# Patient Record
Sex: Male | Born: 1945 | Race: Black or African American | Hispanic: No | Marital: Single | Smoking: Current some day smoker
Health system: Southern US, Community
[De-identification: ages and names within clinical notes are randomized; demographics above are authoritative.]

## PROBLEM LIST (undated history)

## (undated) DIAGNOSIS — I10 Essential (primary) hypertension: Secondary | ICD-10-CM

## (undated) DIAGNOSIS — B009 Herpesviral infection, unspecified: Secondary | ICD-10-CM

## (undated) DIAGNOSIS — M5430 Sciatica, unspecified side: Secondary | ICD-10-CM

## (undated) DIAGNOSIS — N200 Calculus of kidney: Secondary | ICD-10-CM

## (undated) DIAGNOSIS — F1010 Alcohol abuse, uncomplicated: Secondary | ICD-10-CM

## (undated) HISTORY — PX: TONSILLECTOMY: SUR1361

## (undated) HISTORY — PX: ABDOMINAL SURGERY: SHX537

## (undated) HISTORY — PX: PROSTATE SURGERY: SHX751

---

## 2009-01-01 ENCOUNTER — Emergency Department (HOSPITAL_COMMUNITY): Admission: EM | Admit: 2009-01-01 | Discharge: 2009-01-01 | Payer: Self-pay | Admitting: Emergency Medicine

## 2011-10-28 ENCOUNTER — Emergency Department (HOSPITAL_COMMUNITY)
Admission: EM | Admit: 2011-10-28 | Discharge: 2011-10-28 | Disposition: A | Discharge status: Home / Self Care | Payer: Medicare Other | Attending: Emergency Medicine | Admitting: Emergency Medicine

## 2011-10-28 ENCOUNTER — Encounter (HOSPITAL_COMMUNITY): Payer: Self-pay | Admitting: *Deleted

## 2011-10-28 ENCOUNTER — Emergency Department (HOSPITAL_COMMUNITY): Payer: Medicare Other

## 2011-10-28 DIAGNOSIS — E876 Hypokalemia: Secondary | ICD-10-CM | POA: Insufficient documentation

## 2011-10-28 DIAGNOSIS — I10 Essential (primary) hypertension: Secondary | ICD-10-CM | POA: Insufficient documentation

## 2011-10-28 DIAGNOSIS — J45909 Unspecified asthma, uncomplicated: Secondary | ICD-10-CM | POA: Insufficient documentation

## 2011-10-28 DIAGNOSIS — R531 Weakness: Secondary | ICD-10-CM

## 2011-10-28 DIAGNOSIS — R0602 Shortness of breath: Secondary | ICD-10-CM | POA: Insufficient documentation

## 2011-10-28 DIAGNOSIS — R5381 Other malaise: Secondary | ICD-10-CM | POA: Insufficient documentation

## 2011-10-28 DIAGNOSIS — E871 Hypo-osmolality and hyponatremia: Secondary | ICD-10-CM | POA: Insufficient documentation

## 2011-10-28 DIAGNOSIS — Z79899 Other long term (current) drug therapy: Secondary | ICD-10-CM | POA: Insufficient documentation

## 2011-10-28 HISTORY — DX: Calculus of kidney: N20.0

## 2011-10-28 HISTORY — DX: Essential (primary) hypertension: I10

## 2011-10-28 LAB — DIFFERENTIAL
Lymphocytes Relative: 21 % (ref 12–46)
Lymphs Abs: 1.8 10*3/uL (ref 0.7–4.0)
Monocytes Relative: 11 % (ref 3–12)
Neutro Abs: 5.7 10*3/uL (ref 1.7–7.7)
Neutrophils Relative %: 68 % (ref 43–77)

## 2011-10-28 LAB — COMPREHENSIVE METABOLIC PANEL
ALT: 21 U/L (ref 0–53)
Alkaline Phosphatase: 72 U/L (ref 39–117)
BUN: 8 mg/dL (ref 6–23)
Chloride: 95 mEq/L — ABNORMAL LOW (ref 96–112)
GFR calc Af Amer: 65 mL/min — ABNORMAL LOW (ref >90)
Glucose, Bld: 121 mg/dL — ABNORMAL HIGH (ref 70–99)
Potassium: 3.4 mEq/L — ABNORMAL LOW (ref 3.5–5.1)
Total Bilirubin: 0.1 mg/dL — ABNORMAL LOW (ref 0.3–1.2)

## 2011-10-28 LAB — URINALYSIS, ROUTINE W REFLEX MICROSCOPIC
Glucose, UA: NEGATIVE mg/dL
Ketones, ur: 15 mg/dL — AB
Nitrite: NEGATIVE
Protein, ur: 30 mg/dL — AB

## 2011-10-28 LAB — URINE MICROSCOPIC-ADD ON

## 2011-10-28 LAB — CBC
Hemoglobin: 12.1 g/dL — ABNORMAL LOW (ref 13.0–17.0)
RBC: 3.68 MIL/uL — ABNORMAL LOW (ref 4.22–5.81)
WBC: 8.5 10*3/uL (ref 4.0–10.5)

## 2011-10-28 MED ORDER — SODIUM CHLORIDE 0.9 % IV BOLUS (SEPSIS)
500.0000 mL | Freq: Once | INTRAVENOUS | Status: AC
  Administered 2011-10-28: 500 mL via INTRAVENOUS

## 2011-10-28 NOTE — ED Provider Notes (Deleted)
  Physical Exam  BP 121/71  Pulse 76  Temp(Src) 98.4 F (36.9 C) (Oral)  Resp 18  SpO2 98%  Physical Exam  ED Course  Procedures  MDM Patient was short of breath and weakness last couple weeks. States he had to wait to stand on church today. He states his elbow or trouble breathing. He states it has gotten worse since he started on a green pill for his anxiety. No weight loss. No chest pain. No headaches. He has had a decreased appetite overall. He'll be moved to an exam room for further evaluation      Harrold Donath R. Rubin Payor, MD 10/28/11 [phone]

## 2011-10-28 NOTE — Discharge Instructions (Signed)
Foods Rich in Potassium Food / Potassium (mg)  Apricots, dried,  cup / 378 mg   Apricots, raw, 1 cup halves / 401 mg   Avocado,  / 487 mg   Banana, 1 large / 487 mg   Beef, lean, round, 3 oz / 202 mg   Cantaloupe, 1 cup cubes / 427 mg   Dates, medjool, 5 whole / 835 mg   Ham, cured, 3 oz / 212 mg   Lentils, dried,  cup / 458 mg   Lima beans, frozen,  cup / 258 mg   Orange, 1 large / 333 mg   Orange juice, 1 cup / 443 mg   Peaches, dried,  cup / 398 mg   Peas, split, cooked,  cup / 355 mg   Potato, boiled, 1 medium / 515 mg   Prunes, dried, uncooked,  cup / 318 mg   Raisins,  cup / 309 mg   Salmon, pink, raw, 3 oz / 275 mg   Sardines, canned , 3 oz / 338 mg   Tomato, raw, 1 medium / 292 mg   Tomato juice, 6 oz / 417 mg   [place], 3 oz / 349 mg  Document Released: 07/02/2005 Document Revised: 03/14/2011 Document Reviewed: 11/15/2008 [facility] Patient Information 2012 [place], [place].Hyponatremia  Hyponatremia is when the amount of salt (sodium) in your blood is too low. When sodium levels are low, your cells will absorb extra water and swell. The swelling happens throughout the body, but it mostly affects the brain. Severe brain swelling (cerebral edema), seizures, or coma can happen.  CAUSES   Heart, kidney, or liver problems.   Thyroid problems.   Adrenal gland problems.   Severe vomiting and diarrhea.   Certain medicines or illegal drugs.   Dehydration.   Drinking too much water.   Low-sodium diet.  SYMPTOMS   Nausea and vomiting.   Confusion.   Lethargy.   Agitation.   Headache.   Twitching or shaking (seizures).   Unconsciousness.   Appetite loss.   Muscle weakness and cramping.  DIAGNOSIS  Hyponatremia is identified by a simple blood test. Your caregiver will perform a history and physical exam to try to find the cause and type of hyponatremia. Other tests may be needed to measure the amount of sodium in your blood and  urine. TREATMENT  Treatment will depend on the cause.   Fluids may be given through the vein (IV).   Medicines may be used to correct the sodium imbalance. If medicines are causing the problem, they will need to be adjusted.   Water or fluid intake may be restricted to restore proper balance.  The speed of correcting the sodium problem is very important. If the problem is corrected too fast, nerve damage (sometimes unchangeable) can happen. HOME CARE INSTRUCTIONS   Only take medicines as directed by your caregiver. Many medicines can make hyponatremia worse. Discuss all your medicines with your caregiver.   Carefully follow any recommended diet, including any fluid restrictions.   You may be asked to repeat lab tests. Follow these directions.   Avoid alcohol and recreational drugs.  SEEK MEDICAL CARE IF:   You develop worsening nausea, fatigue, headache, confusion, or weakness.   Your original hyponatremia symptoms return.   You have problems following the recommended diet.  SEEK IMMEDIATE MEDICAL CARE IF:   You have a seizure.   You faint.   You have ongoing diarrhea or vomiting.  MAKE SURE YOU:   Understand these  instructions.   Will watch your condition.   Will get help right away if you are not doing well or get worse.  Document Released: 06/22/2002 Document Revised: 06/21/2011 Document Reviewed: 12/17/2010 [facility] Patient Information 2012 [place], [place].

## 2011-10-28 NOTE — ED Provider Notes (Signed)
History     CSN: [id number]  Arrival date & time 10/28/11  1248   First MD Initiated Contact with Patient 10/28/11 1325      Chief Complaint  Patient presents with  . Shortness of Breath  . Weakness    (Consider location/radiation/quality/duration/timing/severity/associated sxs/prior treatment) Patient is a 66 y.o. male presenting with shortness of breath and weakness. The history is provided by the patient.  Shortness of Breath  The current episode started more than 2 weeks ago. Associated symptoms include shortness of breath. Pertinent negatives include no chest pain.  Weakness Primary symptoms do not include headaches, nausea or vomiting.  Additional symptoms include weakness. Additional symptoms do not include neck stiffness.   patient says her shortness of breath and generalized weakness the last 2 weeks. While he was at church today he states he got too weak to stand. He states he feels as if his breathing a little more heavy. He states that it may be related to a new excitability was started on beta screen. No chest pain. No cough. He states he's had decreased appetite. No nausea vomiting or diarrhea  Past Medical History  Diagnosis Date  . Asthma     childhood   . Hypertension   . Kidney calculi     PT reports MD is watching it ,has not removed it    Past Surgical History  Procedure Date  . Tonsillectomy     No family history on file.  History  Substance Use Topics  . Smoking status: Current Some Day Smoker    Types: Cigars  . Smokeless tobacco: Not on file  . Alcohol Use: Yes     daily      Review of Systems  Constitutional: Negative for activity change and appetite change.  HENT: Negative for neck stiffness.   Eyes: Negative for pain.  Respiratory: Positive for shortness of breath. Negative for chest tightness.   Cardiovascular: Negative for chest pain and leg swelling.  Gastrointestinal: Negative for nausea, vomiting, abdominal pain and diarrhea.    Genitourinary: Negative for flank pain.  Musculoskeletal: Negative for back pain.  Skin: Negative for rash.  Neurological: Positive for weakness and light-headedness. Negative for numbness and headaches.  Psychiatric/Behavioral: Negative for behavioral problems.    Allergies  Penicillins  Home Medications   Current Outpatient Rx  Name Route Sig Dispense Refill  . ASPIRIN EC 81 MG PO TBEC Oral Take 162 mg by mouth every 6 (six) hours as needed. For pain    . TESSALON PO Oral Take 1 tablet by mouth daily.    Marland Kitchen VITAMIN D PO Oral Take 1 tablet by mouth daily.    Marland Kitchen FLUTICASONE FUROATE 27.5 MCG/SPRAY NA SUSP Nasal Place 2 sprays into the nose daily as needed. allergies    . NAPHAZOLINE HCL 0.012 % OP SOLN Both Eyes Place 1 drop into both eyes 4 (four) times daily as needed. For allergy eyes    . PRESCRIPTION MEDICATION Oral Take 1 tablet by mouth at bedtime. High Blood pressure med    . PRESCRIPTION MEDICATION Oral Take 0.5 tablets by mouth 2 (two) times daily. For heart rhythm and blood pressure    . PRESCRIPTION MEDICATION Topical Apply topically daily as needed. Herpes ointment    . PRESCRIPTION MEDICATION Oral Take 0.5 tablets by mouth daily. Anxiety medication    . TAMSULOSIN HCL 0.4 MG PO CAPS Oral Take 0.4 mg by mouth daily after supper.      BP 121/71  Pulse 76  Temp(Src) 98.4 F (36.9 C) (Oral)  Resp 18  SpO2 98%  Physical Exam  Nursing note and vitals reviewed. Constitutional: He is oriented to person, place, and time. He appears well-developed and well-nourished.  HENT:  Head: Normocephalic and atraumatic.  Eyes: EOM are normal. Pupils are equal, round, and reactive to light.  Neck: Normal range of motion. Neck supple.  Cardiovascular: Normal rate, regular rhythm and normal heart sounds.   No murmur heard. Pulmonary/Chest: Effort normal and breath sounds normal.  Abdominal: Soft. Bowel sounds are normal. He exhibits no distension and no mass. There is no tenderness.  There is no rebound and no guarding.  Musculoskeletal: Normal range of motion. He exhibits no edema.  Neurological: He is alert and oriented to person, place, and time. No cranial nerve deficit.  Skin: Skin is warm and dry.  Psychiatric: He has a normal mood and affect.    ED Course  Procedures (including critical care time)  Labs Reviewed  CBC - Abnormal; Notable for the following:    RBC 3.68 (*)    Hemoglobin 12.1 (*)    HCT 34.2 (*)    All other components within normal limits  COMPREHENSIVE METABOLIC PANEL - Abnormal; Notable for the following:    Sodium 132 (*)    Potassium 3.4 (*)    Chloride 95 (*)    Glucose, Bld 121 (*)    Albumin 3.4 (*)    AST 40 (*)    Total Bilirubin 0.1 (*)    GFR calc non Af Amer 56 (*)    GFR calc Af Amer 65 (*)    All other components within normal limits  URINALYSIS, ROUTINE W REFLEX MICROSCOPIC - Abnormal; Notable for the following:    Color, Urine AMBER (*) BIOCHEMICALS MAY BE AFFECTED BY COLOR   APPearance CLOUDY (*)    Hgb urine dipstick MODERATE (*)    Bilirubin Urine SMALL (*)    Ketones, ur 15 (*)    Protein, ur 30 (*)    Leukocytes, UA TRACE (*)    All other components within normal limits  URINE MICROSCOPIC-ADD ON - Abnormal; Notable for the following:    Bacteria, UA FEW (*)    Casts HYALINE CASTS (*)    All other components within normal limits  DIFFERENTIAL   Dg Chest 2 View  10/28/2011  *RADIOLOGY REPORT*  Clinical Data: History of asthma, hypertension and smoking.  CHEST - 2 VIEW  Comparison: None.  Findings: There is mild elevation of the left hemidiaphragm with adjacent left basilar linear atelectasis or scarring.  The heart size is normal.  The lungs are otherwise clear.  There is no pleural effusion or pneumothorax.  No acute osseous findings are seen.  IMPRESSION: Mild left basilar scarring or atelectasis.  No acute cardiopulmonary process.  Original Report Authenticated By: Gerrianne Scale, M.D.     1. Weakness     2. Hypokalemia   3. Hyponatremia      Date: 10/28/2011  Rate: 66  Rhythm: normal sinus rhythm  QRS Axis: normal  Intervals: normal  ST/T Wave abnormalities: normal  Conduction Disutrbances:none  Narrative Interpretation:   Old EKG Reviewed: none available    MDM  Patient with shortness of breath and weakness. Lab work is reassuring, mild hyponatremia mild hypokalemia. Patient feels better after some fluids. Has tolerated orals. He'll be discharged home and followup his primary care Dr.        Juliet Rude. Rubin Payor, MD 10/28/11 [phone]

## 2011-10-28 NOTE — ED Notes (Signed)
Patient transported to X-ray 

## 2011-10-28 NOTE — ED Notes (Signed)
PT reports SHOB ,weakness for 2 weeks. This morning while at church Pt reports he was to weak to stand. Pt states he feels like he is breathing heavy.PT reports a HX of palpatations

## 2011-12-04 ENCOUNTER — Emergency Department (HOSPITAL_COMMUNITY)
Admission: EM | Admit: 2011-12-04 | Discharge: 2011-12-04 | Disposition: A | Discharge status: Home / Self Care | Payer: Medicare Other | Attending: Emergency Medicine | Admitting: Emergency Medicine

## 2011-12-04 ENCOUNTER — Emergency Department (HOSPITAL_COMMUNITY): Payer: Medicare Other

## 2011-12-04 ENCOUNTER — Encounter (HOSPITAL_COMMUNITY): Payer: Self-pay | Admitting: Emergency Medicine

## 2011-12-04 DIAGNOSIS — I10 Essential (primary) hypertension: Secondary | ICD-10-CM | POA: Insufficient documentation

## 2011-12-04 DIAGNOSIS — R5383 Other fatigue: Secondary | ICD-10-CM

## 2011-12-04 DIAGNOSIS — Z79899 Other long term (current) drug therapy: Secondary | ICD-10-CM | POA: Insufficient documentation

## 2011-12-04 DIAGNOSIS — F1010 Alcohol abuse, uncomplicated: Secondary | ICD-10-CM | POA: Insufficient documentation

## 2011-12-04 DIAGNOSIS — I7090 Unspecified atherosclerosis: Secondary | ICD-10-CM | POA: Insufficient documentation

## 2011-12-04 DIAGNOSIS — J45909 Unspecified asthma, uncomplicated: Secondary | ICD-10-CM | POA: Insufficient documentation

## 2011-12-04 DIAGNOSIS — R5381 Other malaise: Secondary | ICD-10-CM | POA: Insufficient documentation

## 2011-12-04 HISTORY — DX: Sciatica, unspecified side: M54.30

## 2011-12-04 HISTORY — DX: Herpesviral infection, unspecified: B00.9

## 2011-12-04 HISTORY — DX: Alcohol abuse, uncomplicated: F10.10

## 2011-12-04 LAB — POCT I-STAT TROPONIN I: Troponin i, poc: 0 ng/mL (ref 0.00–0.08)

## 2011-12-04 LAB — CBC
MCV: 96.9 fL (ref 78.0–100.0)
Platelets: 125 10*3/uL — ABNORMAL LOW (ref 150–400)
RBC: 3.55 MIL/uL — ABNORMAL LOW (ref 4.22–5.81)
RDW: 15.3 % (ref 11.5–15.5)
WBC: 4.5 10*3/uL (ref 4.0–10.5)

## 2011-12-04 LAB — COMPREHENSIVE METABOLIC PANEL
ALT: 28 U/L (ref 0–53)
AST: 69 U/L — ABNORMAL HIGH (ref 0–37)
Albumin: 3.5 g/dL (ref 3.5–5.2)
Alkaline Phosphatase: 63 U/L (ref 39–117)
CO2: 20 mEq/L (ref 19–32)
Chloride: 101 mEq/L (ref 96–112)
Creatinine, Ser: 0.69 mg/dL (ref 0.50–1.35)
GFR calc non Af Amer: >90 mL/min (ref >90)
Potassium: 3.5 mEq/L (ref 3.5–5.1)
Sodium: 140 mEq/L (ref 135–145)
Total Bilirubin: 0.4 mg/dL (ref 0.3–1.2)

## 2011-12-04 LAB — URINE MICROSCOPIC-ADD ON

## 2011-12-04 LAB — URINALYSIS, ROUTINE W REFLEX MICROSCOPIC
Bilirubin Urine: NEGATIVE
Glucose, UA: NEGATIVE mg/dL
Ketones, ur: NEGATIVE mg/dL
Leukocytes, UA: NEGATIVE
Protein, ur: 30 mg/dL — AB
pH: 5.5 (ref 5.0–8.0)

## 2011-12-04 MED ORDER — SODIUM CHLORIDE 0.9 % IV BOLUS (SEPSIS)
1000.0000 mL | Freq: Once | INTRAVENOUS | Status: AC
  Administered 2011-12-04: 1000 mL via INTRAVENOUS

## 2011-12-04 MED ORDER — ONDANSETRON HCL 8 MG PO TABS
8.0000 mg | ORAL_TABLET | Freq: Once | ORAL | Status: AC
  Administered 2011-12-04: 8 mg via ORAL
  Filled 2011-12-04: qty 1

## 2011-12-04 NOTE — ED Notes (Signed)
Pt smells of ETOH despite denying having any ETOH today.

## 2011-12-04 NOTE — ED Notes (Signed)
Pt returned from bathroom; pt states he wants to sit in chair.  Told pt that he could sit there for a little while; that nursing would get vital signs shortly from patient.

## 2011-12-04 NOTE — ED Notes (Signed)
Pt states to this RN, "I think I may feel better already. Maybe it's just being around people and talking."

## 2011-12-04 NOTE — ED Notes (Signed)
Pt coughing and hacking. Pt states he feels like he is gagging.  Pt states he is unsure what from. Asked if he wanted anything; pt denies needing/wanting anything.

## 2011-12-04 NOTE — ED Provider Notes (Signed)
History     CSN: [id number]  Arrival date & time 12/04/11  1546   First MD Initiated Contact with Patient 12/04/11 1549      Chief Complaint  Patient presents with  . Fatigue    (Consider location/radiation/quality/duration/timing/severity/associated sxs/prior treatment) HPI  Patient presents with complaint of generalized fatigue and weakness. He states he's been feeling this way for over 2 months.  He also reports feeling somewhat short of breath has also been constant over the past 2 weeks. He denies any chest pain. He denies any fever vomiting or diarrhea.  He also reports that he is a heavy/daily drinker- average of a fifth of whiskey daily.  Last drink was yesterday and today he felt that he might pass out.  Has had decreased po intake.  Takes blood pressure medication, but has had no recent change in meds.  There are no other alleviating or modifying factors, there are no other associated systemic symptoms.    Past Medical History  Diagnosis Date  . Asthma     childhood   . Hypertension   . Kidney calculi     PT reports MD is watching it ,has not removed it  . Alcohol abuse   . Sciatica   . Herpes     facial/lip    Past Surgical History  Procedure Date  . Tonsillectomy   . Abdominal surgery     biopsy  . Prostate surgery     History reviewed. No pertinent family history.  History  Substance Use Topics  . Smoking status: Current Some Day Smoker    Types: Cigars  . Smokeless tobacco: Not on file  . Alcohol Use: Yes     1/5 whiskey on daily average      Review of Systems ROS reviewed and all otherwise negative except for mentioned in HPI  Allergies  Penicillins  Home Medications   Current Outpatient Rx  Name Route Sig Dispense Refill  . VITAMIN D 1000 UNITS PO TABS Oral Take 1,000 Units by mouth daily.    Marland Kitchen FLUTICASONE FUROATE 27.5 MCG/SPRAY NA SUSP Nasal Place 2 sprays into the nose daily as needed. allergies    . LISINOPRIL-HYDROCHLOROTHIAZIDE  20-25 MG PO TABS Oral Take 1 tablet by mouth daily.    Marland Kitchen LOPERAMIDE HCL 2 MG PO CAPS Oral Take 2 mg by mouth 4 (four) times daily as needed. For constipation.    Marland Kitchen LOPERAMIDE-SIMETHICONE 2-125 MG PO TABS Oral Take 1 capsule by mouth every 6 (six) hours as needed. For constipation.    Marland Kitchen METOPROLOL TARTRATE 25 MG PO TABS Oral Take 12.5 mg by mouth 2 (two) times daily.    Marland Kitchen NAPHAZOLINE HCL 0.012 % OP SOLN Both Eyes Place 1 drop into both eyes 4 (four) times daily as needed. For allergy eyes    . PHENAZOPYRIDINE HCL 100 MG PO TABS Oral Take 100 mg by mouth 3 (three) times daily as needed. For burning/pain.    Marland Kitchen SERTRALINE HCL 50 MG PO TABS Oral Take 50 mg by mouth daily.    Marland Kitchen SILDENAFIL CITRATE 100 MG PO TABS Oral Take 100 mg by mouth daily as needed. For E.D.    . TAMSULOSIN HCL 0.4 MG PO CAPS Oral Take 0.4 mg by mouth daily after supper.      BP 155/83  Pulse 75  Temp(Src) 98.6 F (37 C) (Oral)  Resp 16  SpO2 97% Vitals reviewed Physical Exam Physical Examination: General appearance - alert, well appearing, and in no  distress Mental status - alert, oriented to person, place, and time Eyes - pupils equal and reactive, extraocular eye movements intact Mouth - mucous membranes moist, pharynx normal without lesions Chest - clear to auscultation, no wheezes, rales or rhonchi, symmetric air entry Heart - normal rate, regular rhythm, normal S1, S2, no murmurs, rubs, clicks or gallops Abdomen - soft, nontender, nondistended, no masses or organomegaly, nabs Neurological - alert, oriented, normal speech, no focal findings, cranial nerves grossly intact, strength 5/5 in extremities x 4 Extremities - peripheral pulses normal, no pedal edema, no clubbing or cyanosis Skin - normal coloration and turgor, no rashes, brisk cap refill Psych- calm, cooperative ED Course  Procedures (including critical care time)  Date: 12/04/2011  Rate: 64  Rhythm: normal sinus rhythm  QRS Axis: normal  Intervals:  normal, borderline prolonged PR interval  ST/T Wave abnormalities: nonspecific T wave changes  Conduction Disutrbances:none  Narrative Interpretation:   Old EKG Reviewed: none available    Labs Reviewed  CBC - Abnormal; Notable for the following:    RBC 3.55 (*)    Hemoglobin 11.7 (*)    HCT 34.4 (*)    Platelets 125 (*)    All other components within normal limits  COMPREHENSIVE METABOLIC PANEL - Abnormal; Notable for the following:    AST 69 (*)    All other components within normal limits  ETHANOL - Abnormal; Notable for the following:    Alcohol, Ethyl (B) 158 (*)    All other components within normal limits  URINALYSIS, ROUTINE W REFLEX MICROSCOPIC - Abnormal; Notable for the following:    Hgb urine dipstick TRACE (*)    Protein, ur 30 (*)    All other components within normal limits  URINE MICROSCOPIC-ADD ON - Abnormal; Notable for the following:    Casts HYALINE CASTS (*)    All other components within normal limits  POCT I-STAT TROPONIN I  LAB REPORT - SCANNED   Dg Chest 2 View  12/04/2011  *RADIOLOGY REPORT*  Clinical Data: Fatigue and weakness today.  CHEST - 2 VIEW  Comparison: Chest x-ray 10/28/2011.  Findings: Lung volumes are normal.  There is a linear opacity projecting in the region of the lingula, similar to prior studies, likely represent an area of scarring.  No consolidative airspace disease.  No pleural effusions.  Pulmonary vasculature is normal. Heart size is within normal limits. The patient is rotated to the right on today's exam, resulting in distortion of the mediastinal contours and reduced diagnostic sensitivity and specificity for mediastinal pathology.  Atherosclerotic calcifications in the thoracic aorta.  IMPRESSION: 1.  No radiographic evidence of acute cardiopulmonary disease. 2.  Atherosclerosis.  Original Report Authenticated By: Florencia Reasons, M.D.     1. Fatigue   2. Alcohol abuse       MDM  Pt with fatigue, alcohol abuse- labs  reassuring, pt has had occasional gagging while in ED- no actual vomiting.  No signs of alcohol withdrawal, pt actually mildly intoxicated, but cooperative, ambulatory.  PT tolerated fluids and food in the ED after zofran.  Discharged with strict return precautions and he is agreeable with this plan.  States he has an appointment scheduled with his doctor at the [facility] in [place].   Ethelda Chick, MD 12/05/11 [phone]

## 2011-12-04 NOTE — ED Notes (Signed)
Pt in from home via [facility] for increased malaise, fatigue; pt states "i'm an alcoholic and i havent had anything to drink today."  Pt alert and oriented.  Pt states that he can hardly feed himself.

## 2011-12-04 NOTE — ED Notes (Signed)
Pt ambulated to bathroom with steady gait. 

## 2011-12-04 NOTE — ED Notes (Signed)
Pt ambulated to BR without difficulty

## 2011-12-04 NOTE — Discharge Instructions (Signed)
Return to the ED with any concerns including chest pain, difficulty breathing, vomiting and not able to keep down liquids, fainting, seizure, decreased level of alertness/lethargy, or any other alarming symptoms

## 2011-12-05 MED ORDER — IBUPROFEN 100 MG/5ML PO SUSP
ORAL | Status: AC
  Filled 2011-12-05: qty 20

## 2013-04-17 IMAGING — CR DG CHEST 2V
2 series · 2 of 2 positions shown · non-contrast
Comparison: None.

CLINICAL DATA: History of asthma, hypertension and smoking.

CHEST - 2 VIEW

[w chest pa]
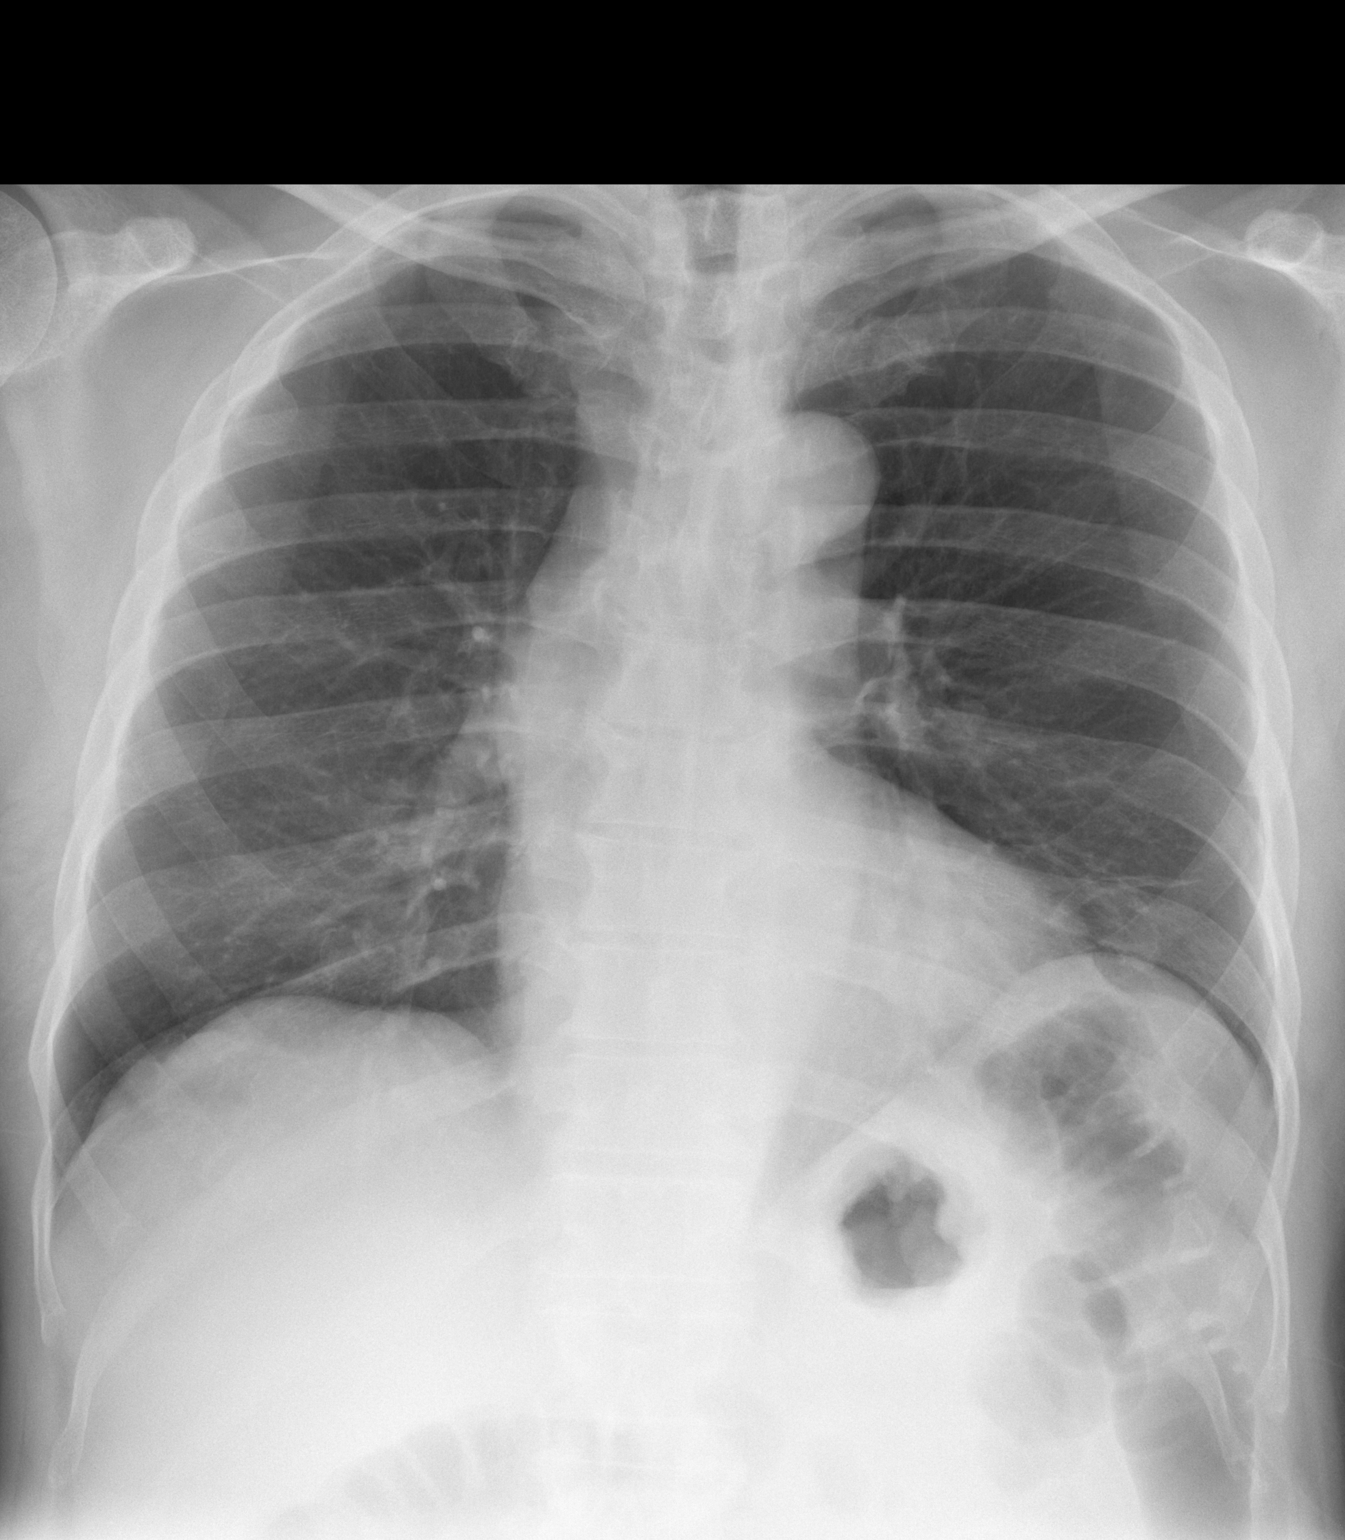

[w chest lat *]
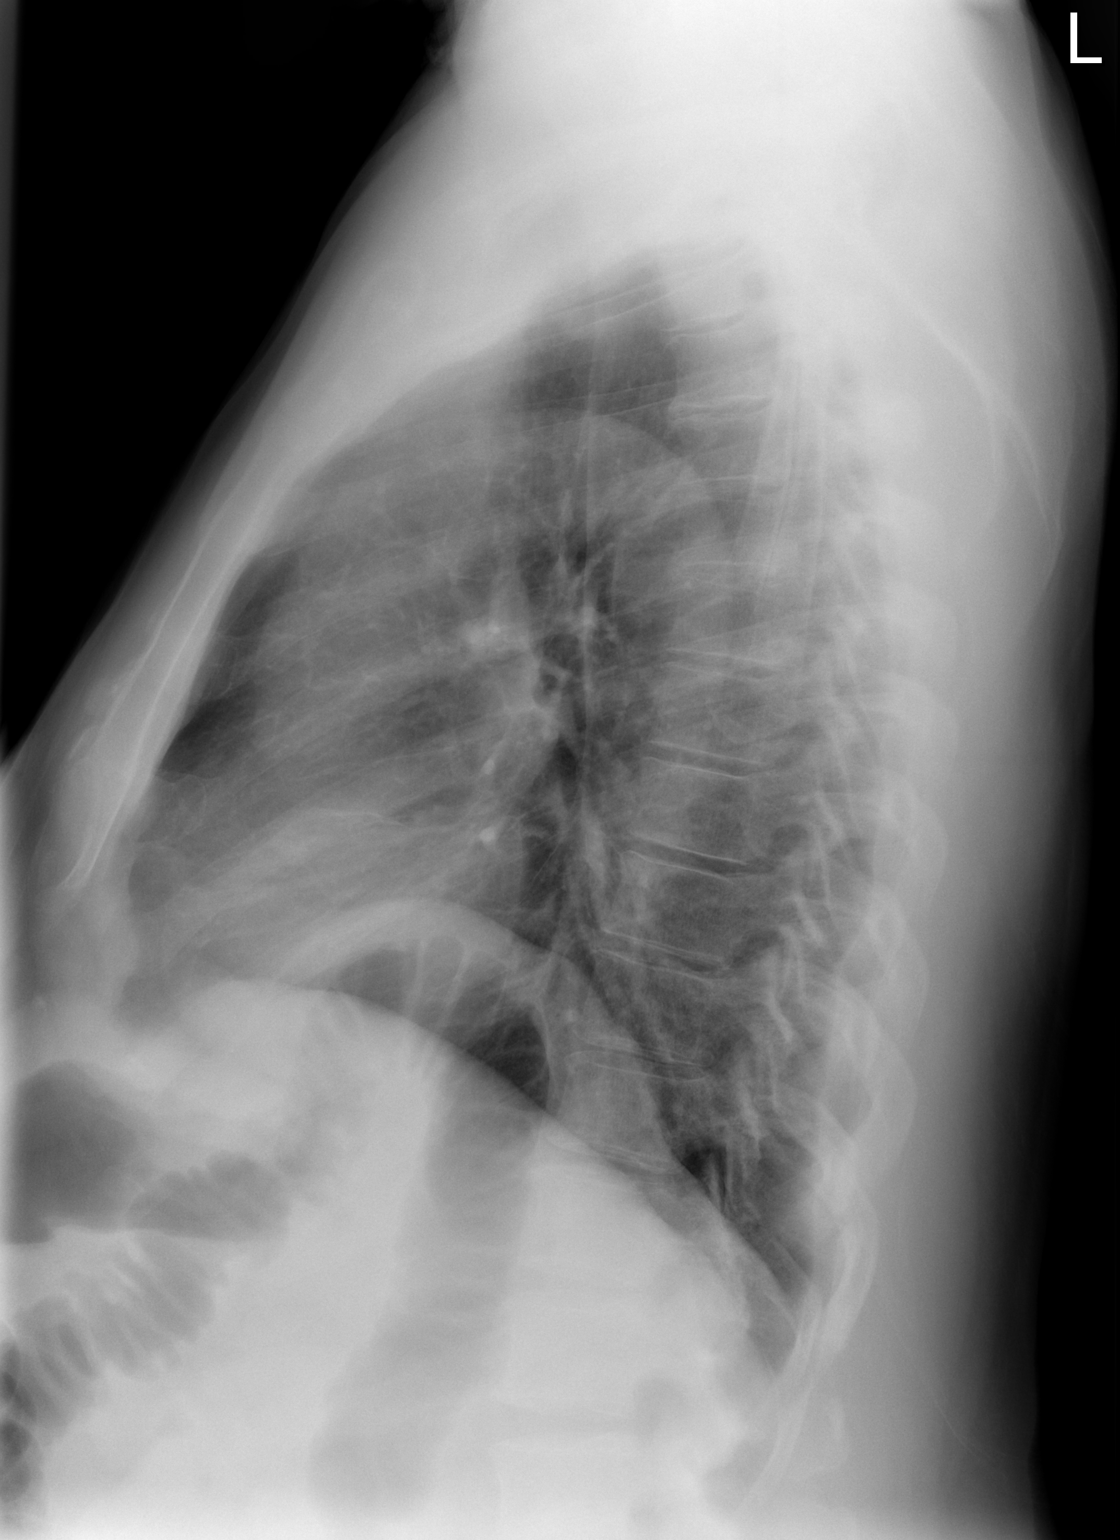

[2 of 2 positions shown; findings below may reference images not displayed]

FINDINGS: There is mild elevation of the left hemidiaphragm with
adjacent left basilar linear atelectasis or scarring.  The heart
size is normal.  The lungs are otherwise clear.  There is no
pleural effusion or pneumothorax.  No acute osseous findings are
seen.
IMPRESSION: Mild left basilar scarring or atelectasis.  No acute
cardiopulmonary process.

## 2013-05-24 IMAGING — CR DG CHEST 2V
2 series · 2 of 2 positions shown · non-contrast
Comparison: Chest x-ray 10/28/2011.

CLINICAL DATA: Fatigue and weakness today.

CHEST - 2 VIEW

[w chest pa]
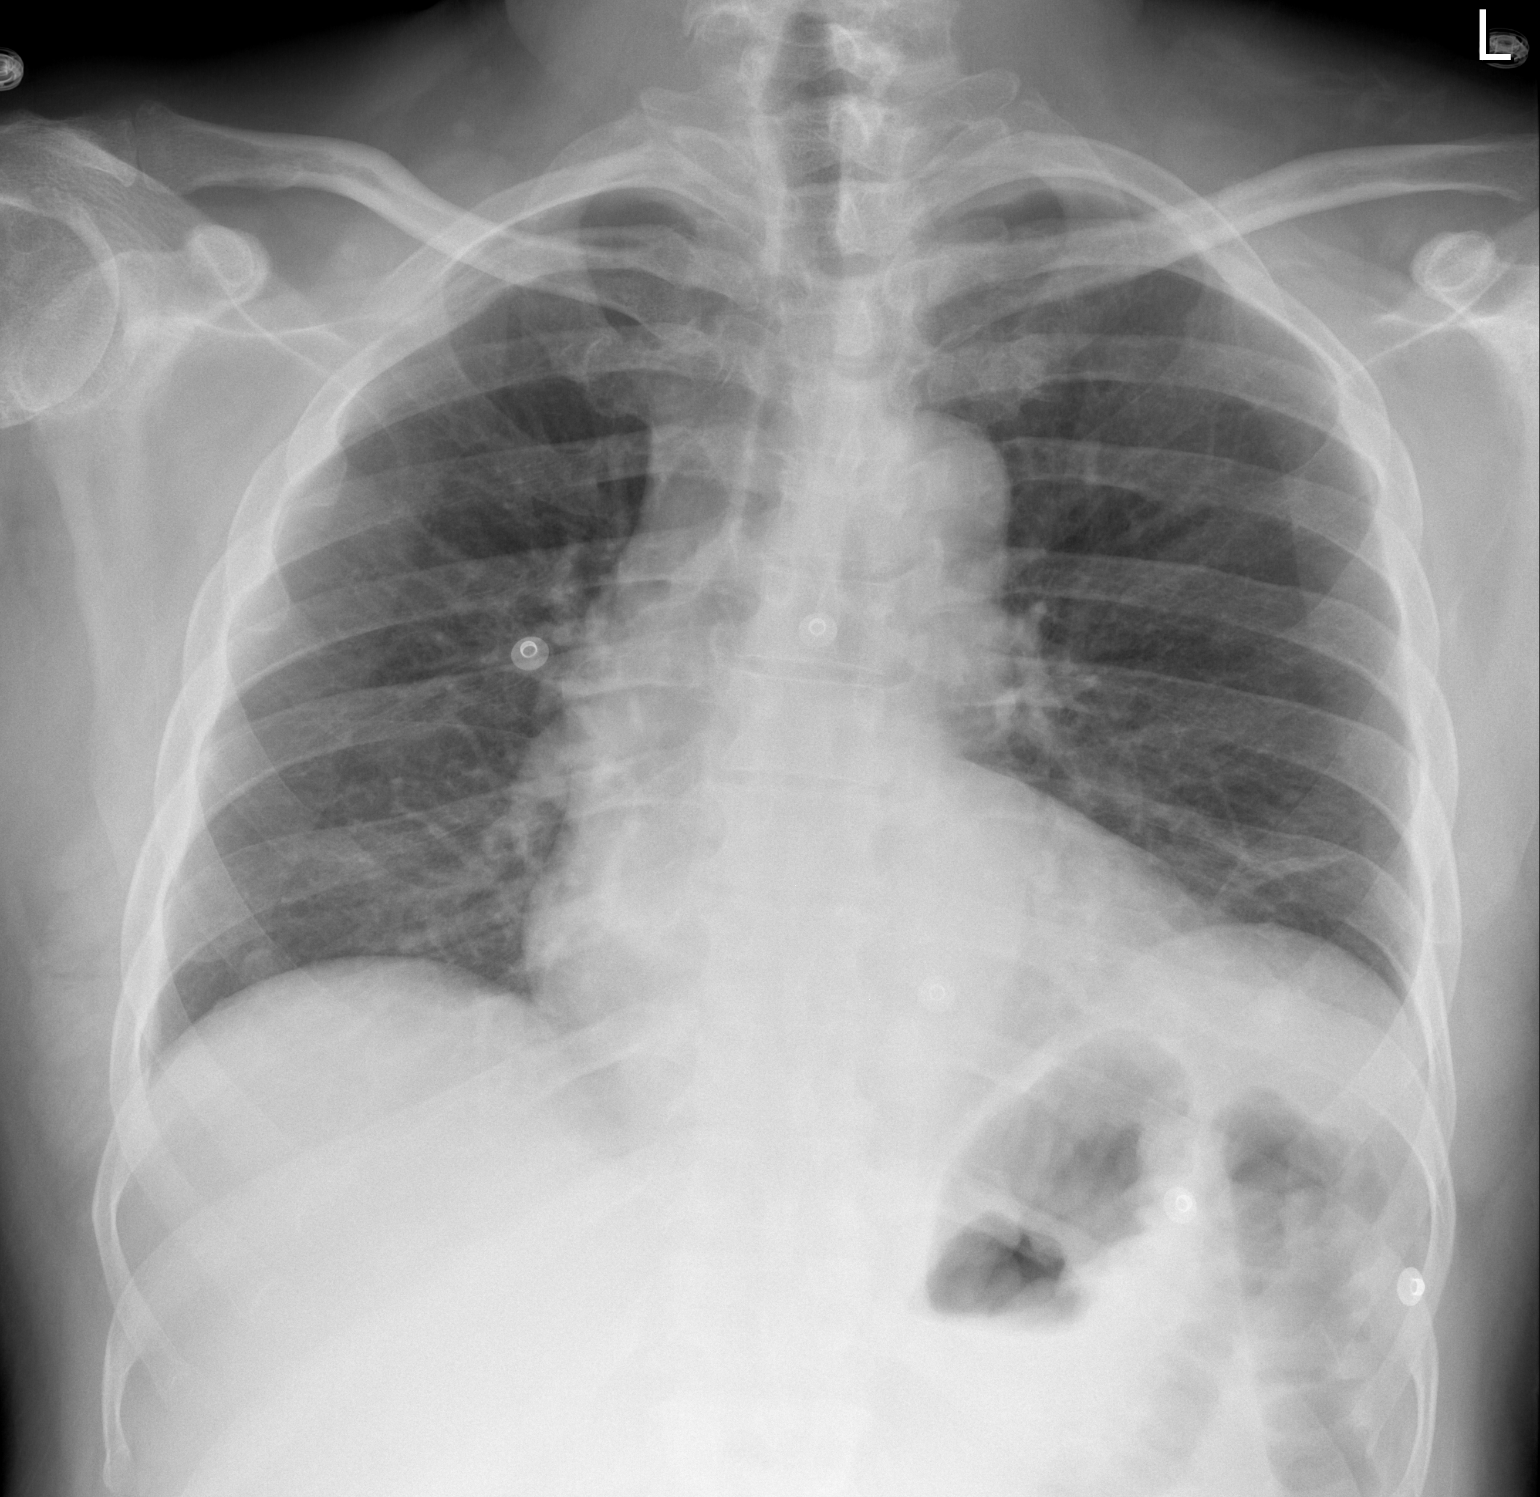

[w chest lat]
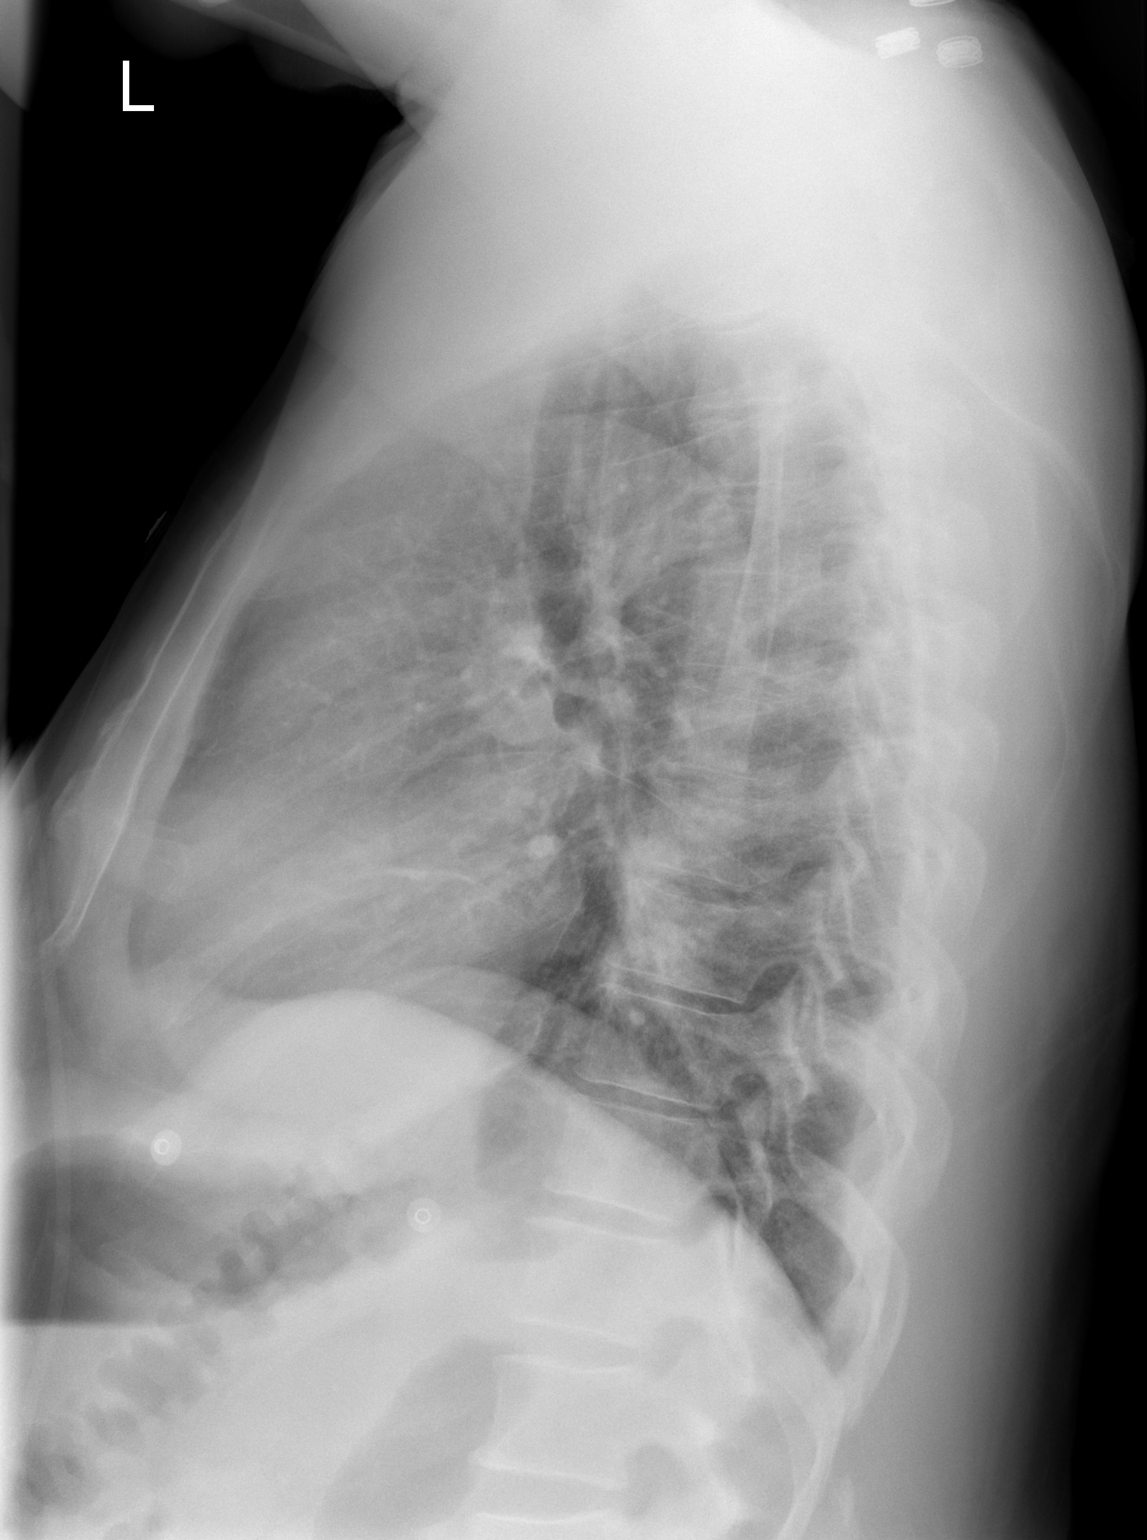

[2 of 2 positions shown; findings below may reference images not displayed]

FINDINGS: Lung volumes are normal.  There is a linear opacity
projecting in the region of the lingula, similar to prior studies,
likely represent an area of scarring.  No consolidative airspace
disease.  No pleural effusions.  Pulmonary vasculature is normal.
Heart size is within normal limits. The patient is rotated to the
right on today's exam, resulting in distortion of the mediastinal
contours and reduced diagnostic sensitivity and specificity for
mediastinal pathology.  Atherosclerotic calcifications in the
thoracic aorta.
IMPRESSION: 1.  No radiographic evidence of acute cardiopulmonary disease.
2.  Atherosclerosis.

## 2017-04-27 ENCOUNTER — Encounter (HOSPITAL_COMMUNITY): Payer: Self-pay

## 2017-04-27 ENCOUNTER — Emergency Department (HOSPITAL_COMMUNITY): Payer: Non-veteran care

## 2017-04-27 ENCOUNTER — Emergency Department (HOSPITAL_COMMUNITY)
Admission: EM | Admit: 2017-04-27 | Discharge: 2017-04-27 | Disposition: A | Discharge status: Home / Self Care | Payer: Non-veteran care | Attending: Emergency Medicine | Admitting: Emergency Medicine

## 2017-04-27 DIAGNOSIS — J45909 Unspecified asthma, uncomplicated: Secondary | ICD-10-CM | POA: Diagnosis not present

## 2017-04-27 DIAGNOSIS — F17210 Nicotine dependence, cigarettes, uncomplicated: Secondary | ICD-10-CM | POA: Insufficient documentation

## 2017-04-27 DIAGNOSIS — R531 Weakness: Secondary | ICD-10-CM

## 2017-04-27 DIAGNOSIS — I10 Essential (primary) hypertension: Secondary | ICD-10-CM | POA: Insufficient documentation

## 2017-04-27 DIAGNOSIS — R197 Diarrhea, unspecified: Secondary | ICD-10-CM | POA: Insufficient documentation

## 2017-04-27 DIAGNOSIS — Z79899 Other long term (current) drug therapy: Secondary | ICD-10-CM | POA: Diagnosis not present

## 2017-04-27 LAB — CBC WITH DIFFERENTIAL/PLATELET
BASOS ABS: 0 10*3/uL (ref 0.0–0.1)
Basophils Relative: 0 %
Eosinophils Absolute: 0 10*3/uL (ref 0.0–0.7)
Eosinophils Relative: 0 %
HCT: 36.2 % — ABNORMAL LOW (ref 39.0–52.0)
HEMOGLOBIN: 12 g/dL — AB (ref 13.0–17.0)
LYMPHS ABS: 1.2 10*3/uL (ref 0.7–4.0)
LYMPHS PCT: 12 %
MCH: 30.9 pg (ref 26.0–34.0)
MCHC: 33.1 g/dL (ref 30.0–36.0)
MCV: 93.3 fL (ref 78.0–100.0)
Monocytes Absolute: 1.2 10*3/uL — ABNORMAL HIGH (ref 0.1–1.0)
Monocytes Relative: 12 %
NEUTROS ABS: 7.7 10*3/uL (ref 1.7–7.7)
NEUTROS PCT: 76 %
Platelets: 93 10*3/uL — ABNORMAL LOW (ref 150–400)
RBC: 3.88 MIL/uL — AB (ref 4.22–5.81)
RDW: 15.6 % — ABNORMAL HIGH (ref 11.5–15.5)
WBC: 10.1 10*3/uL (ref 4.0–10.5)

## 2017-04-27 LAB — ETHANOL: Alcohol, Ethyl (B): <10 mg/dL (ref <10)

## 2017-04-27 LAB — MAGNESIUM: Magnesium: 1 mg/dL — ABNORMAL LOW (ref 1.7–2.4)

## 2017-04-27 LAB — COMPREHENSIVE METABOLIC PANEL
ALT: 40 U/L (ref 17–63)
ANION GAP: 14 (ref 5–15)
AST: 134 U/L — ABNORMAL HIGH (ref 15–41)
Albumin: 4 g/dL (ref 3.5–5.0)
Alkaline Phosphatase: 95 U/L (ref 38–126)
BILIRUBIN TOTAL: 1.5 mg/dL — AB (ref 0.3–1.2)
BUN: 23 mg/dL — ABNORMAL HIGH (ref 6–20)
CO2: 21 mmol/L — ABNORMAL LOW (ref 22–32)
Calcium: 8.9 mg/dL (ref 8.9–10.3)
Chloride: 102 mmol/L (ref 101–111)
Creatinine, Ser: 1.07 mg/dL (ref 0.61–1.24)
GFR calc non Af Amer: >60 mL/min (ref >60)
Glucose, Bld: 94 mg/dL (ref 65–99)
POTASSIUM: 3.4 mmol/L — AB (ref 3.5–5.1)
SODIUM: 137 mmol/L (ref 135–145)
Total Protein: 8 g/dL (ref 6.5–8.1)

## 2017-04-27 MED ORDER — SODIUM CHLORIDE 0.9 % IV BOLUS (SEPSIS)
1000.0000 mL | Freq: Once | INTRAVENOUS | Status: AC
  Administered 2017-04-27: 1000 mL via INTRAVENOUS

## 2017-04-27 MED ORDER — FOLIC ACID 1 MG PO TABS
1.0000 mg | ORAL_TABLET | Freq: Once | ORAL | Status: AC
  Administered 2017-04-27: 1 mg via ORAL
  Filled 2017-04-27: qty 1

## 2017-04-27 MED ORDER — NEPHRO-VITE RX 1 MG PO TABS: 1.0000 | ORAL_TABLET | Freq: Every day | ORAL | 0 refills | Status: AC

## 2017-04-27 MED ORDER — VITAMIN B-1 100 MG PO TABS
100.0000 mg | ORAL_TABLET | Freq: Once | ORAL | Status: AC
  Administered 2017-04-27: 100 mg via ORAL
  Filled 2017-04-27: qty 1

## 2017-04-27 MED ORDER — MAGNESIUM SULFATE 2 GM/50ML IV SOLN
2.0000 g | Freq: Once | INTRAVENOUS | Status: AC
  Administered 2017-04-27: 2 g via INTRAVENOUS
  Filled 2017-04-27: qty 50

## 2017-04-27 NOTE — ED Notes (Signed)
Pt friend came to bedside, pt crying, wanting to go home and "get a drink".  HR increased 152, Dr. Jeraldine Loots made aware.

## 2017-04-27 NOTE — Discharge Instructions (Signed)
As discussed, it is very important that you follow-up with your physician as scheduled in 2 days. In the interval, please take all medication as directed, and monitor your condition carefully.  Return here for concerning changes in your condition.

## 2017-04-27 NOTE — ED Triage Notes (Addendum)
To room via EMS.  Onset x 1 week leg weakness.  Onset today pt was on toilet and could not stand up.  Pt called housekeeper to come over to help him.   Pt did not recognize housekeeper at first and housekeeper reports pt has having visual hallucinations - seeing things across the street.   Housekeeper got pt off toilet and into chair.  Pt is A&Ox4 at this time.  Housekeeper reported to EMS that there was blood in his stool. Pt c/o heart palpitations last night, none today.  Denies UTI symptoms.  Pt reports drinking 1/5 whiskey a day.  Today pt had rock glass and half to drink of whiskey.

## 2017-04-27 NOTE — ED Provider Notes (Signed)
[facility]-EMERGENCY DEPT Provider Note   CSN: [id number] Arrival date & time: 04/27/17  1508     History   Chief Complaint Chief Complaint  Patient presents with  . Weakness    HPI Derek Whitaker is a 71 y.o. male.  HPI Patient presents due to concern weakness, diarrhea. Patient acknowledges eventually, drinking substantially, daily. He notes over the past week he has had generalized weakness, as well as more subsequent weakness in both lower extremities. During this time he has also had multiple episodes of diarrhea, andsome visual hallucination. No auditory hallucination, no thoughts of self-harm. Patient denies abdominal pain, does have some nausea. He has had occasional palpitations, though no resent chest pain or palpitations.    Past Medical History:  Diagnosis Date  . Alcohol abuse   . Asthma    childhood   . Herpes    facial/lip  . Hypertension   . Kidney calculi    PT reports MD is watching it ,has not removed it  . Sciatica     There are no active problems to display for this patient.   Past Surgical History:  Procedure Laterality Date  . ABDOMINAL SURGERY     biopsy  . PROSTATE SURGERY    . TONSILLECTOMY         Home Medications    Prior to Admission medications   Medication Sig Start Date End Date Taking? Authorizing Provider  cholecalciferol (VITAMIN D) 1000 UNITS tablet Take 1,000 Units by mouth daily.    [provider]  fluticasone (VERAMYST) 27.5 MCG/SPRAY nasal spray Place 2 sprays into the nose daily as needed. allergies    [provider]  lisinopril-hydrochlorothiazide (PRINZIDE,ZESTORETIC) 20-25 MG per tablet Take 1 tablet by mouth daily.    [provider]  loperamide (IMODIUM) 2 MG capsule Take 2 mg by mouth 4 (four) times daily as needed. For constipation.    [provider]  Loperamide-Simethicone (IMODIUM MULTI-SYMPTOM RELIEF) 2-125 MG TABS Take 1 capsule by mouth every 6 (six) hours as needed.  For constipation.    [provider]  metoprolol tartrate (LOPRESSOR) 25 MG tablet Take 12.5 mg by mouth 2 (two) times daily.    [provider]  naphazoline (CLEAR EYES) 0.012 % ophthalmic solution Place 1 drop into both eyes 4 (four) times daily as needed. For allergy eyes    [provider]  phenazopyridine (PYRIDIUM) 100 MG tablet Take 100 mg by mouth 3 (three) times daily as needed. For burning/pain.    [provider]  sertraline (ZOLOFT) 50 MG tablet Take 50 mg by mouth daily.    [provider]  sildenafil (VIAGRA) 100 MG tablet Take 100 mg by mouth daily as needed. For E.D.    [provider]  Tamsulosin HCl (FLOMAX) 0.4 MG CAPS Take 0.4 mg by mouth daily after supper.    [provider]    Family History No family history on file.  Social History Social History  Substance Use Topics  . Smoking status: Current Some Day Smoker    Types: Cigars  . Smokeless tobacco: Never Used  . Alcohol use Yes     Comment: 1/5 whiskey on daily average     Allergies   Penicillins   Review of Systems Review of Systems  Constitutional:       Per HPI, otherwise negative  HENT:       Per HPI, otherwise negative  Respiratory:       Per HPI, otherwise negative  Cardiovascular:       Per HPI, otherwise negative  Gastrointestinal: Negative for vomiting.  Endocrine:       Negative aside from HPI  Genitourinary:       No incontinence. He does have a history of prostate surgery, notes that he has had inconsistent voiding capacity since that time.   Musculoskeletal:       Per HPI, otherwise negative  Skin: Negative.   Neurological: Positive for weakness. Negative for syncope.     Physical Exam Updated Vital Signs BP (!) 164/93 (BP Location: Right Arm)   Pulse (!) 103   Temp 100.3 F (37.9 C) (Rectal)   Resp (!) 26   SpO2 97%   Physical Exam  Constitutional: He is oriented to person, place, and time.  Neurological:  He is oriented to person, place, and time. He displays no atrophy and no tremor. No cranial nerve deficit or sensory deficit. He exhibits normal muscle tone. He displays no seizure activity.  Both lower extremity 5/5 strength, unremarkable     ED Treatments / Results  Labs (all labs ordered are listed, but only abnormal results are displayed) Labs Reviewed  COMPREHENSIVE METABOLIC PANEL - Abnormal; Notable for the following:       Result Value   Potassium 3.4 (*)    CO2 21 (*)    BUN 23 (*)    AST 134 (*)    Total Bilirubin 1.5 (*)    All other components within normal limits  CBC WITH DIFFERENTIAL/PLATELET - Abnormal; Notable for the following:    RBC 3.88 (*)    Hemoglobin 12.0 (*)    HCT 36.2 (*)    RDW 15.6 (*)    Platelets 93 (*)    Monocytes Absolute 1.2 (*)    All other components within normal limits  MAGNESIUM - Abnormal; Notable for the following:    Magnesium 1.0 (*)    All other components within normal limits  ETHANOL    EKG  EKG Interpretation  Date/Time:  Saturday April 27 2017 15:31:00 EDT Ventricular Rate:  90 PR Interval:    QRS Duration: 86 QT Interval:  354 QTC Calculation: 434 R Axis:   25 Text Interpretation:  Sinus rhythm Abnormal R-wave progression, early transition Borderline T abnormalities, lateral leads Artifact Abnormal ekg Confirmed by Gerhard Munch [phone]) on 04/27/2017 5:10:13 PM       Radiology Dg Chest 2 View  Result Date: 04/27/2017 CLINICAL DATA:  Leg weakness with onset 1 week ago. EXAM: CHEST  2 VIEW COMPARISON:  12/04/2011 FINDINGS: Cardiomediastinal silhouette is normal. Mediastinal contours appear intact. Tortuosity and calcific atherosclerotic disease of the aorta. There is no evidence of focal airspace consolidation, pleural effusion or pneumothorax. Osseous structures are without acute abnormality. Soft tissues are grossly normal. IMPRESSION: No active cardiopulmonary disease. Tortuosity and calcific atherosclerotic  disease of the aorta. Electronically Signed   By: Ted Mcalpine M.D.   On: 04/27/2017 17:53    Procedures Procedures (including critical care time)  Medications Ordered in ED Medications  thiamine (VITAMIN B-1) tablet 100 mg (100 mg Oral Given 04/27/17 1816)  folic acid (FOLVITE) tablet 1 mg (1 mg Oral Given 04/27/17 1816)  magnesium sulfate IVPB 2 g 50 mL (0 g Intravenous Stopped 04/27/17 1916)  sodium chloride 0.9 % bolus 1,000 mL (1,000 mLs Intravenous New Bag/Given 04/27/17 1817)     Initial Impression / Assessment and Plan / ED Course  I have reviewed the triage vital signs and the nursing notes.  Pertinent labs & imaging results that were available during my care of the patient were reviewed by me and considered in my medical decision making (see chart for details).  Initial labs notable for hypomagnesemia, hypokalemia. Given the patient's drinking history, there suspicion for vitamin deficiencies, as well as electrolyte deficiencies. Patient received IV fluid,magnesium, folic acid, thiamine.   8:38 PM Patient's tachycardia has resolved, heart rate 95/105. I discussed all findings with patient and multiple friend, companions. We discussed importance of drinking alcohol only in moderation, taking multivitamin, and having a repeat evaluation within the coming days. Patient has outpatient follow-up scheduled in 48 hours. With no ongoing arrhythmia, no substantial complains, and reassuring findings, after repletion of hypomagnesemia, presumed vitamin deficiency secondary to alcohol abuse, the patient was discharged in stable condition. Final Clinical Impressions(s) / ED Diagnoses   Final diagnoses:  Weakness  Hypomagnesemia    New Prescriptions New Prescriptions   B COMPLEX-C-FOLIC ACID (B COMPLEX-VITAMIN C-FOLIC ACID) 1 MG TABLET    Take 1 tablet by mouth daily with breakfast.     Gerhard Munch, MD 04/27/17 2039

## 2017-04-30 ENCOUNTER — Emergency Department (HOSPITAL_COMMUNITY)
Admission: EM | Admit: 2017-04-30 | Discharge: 2017-04-30 | Disposition: A | Discharge status: Home / Self Care | Payer: Non-veteran care | Attending: Emergency Medicine | Admitting: Emergency Medicine

## 2017-04-30 ENCOUNTER — Encounter (HOSPITAL_COMMUNITY): Payer: Self-pay

## 2017-04-30 ENCOUNTER — Emergency Department (HOSPITAL_COMMUNITY): Payer: Non-veteran care

## 2017-04-30 DIAGNOSIS — I10 Essential (primary) hypertension: Secondary | ICD-10-CM | POA: Diagnosis not present

## 2017-04-30 DIAGNOSIS — Z79899 Other long term (current) drug therapy: Secondary | ICD-10-CM | POA: Insufficient documentation

## 2017-04-30 DIAGNOSIS — E876 Hypokalemia: Secondary | ICD-10-CM

## 2017-04-30 DIAGNOSIS — R197 Diarrhea, unspecified: Secondary | ICD-10-CM | POA: Insufficient documentation

## 2017-04-30 DIAGNOSIS — F1010 Alcohol abuse, uncomplicated: Secondary | ICD-10-CM | POA: Diagnosis present

## 2017-04-30 DIAGNOSIS — F17210 Nicotine dependence, cigarettes, uncomplicated: Secondary | ICD-10-CM | POA: Diagnosis not present

## 2017-04-30 LAB — MAGNESIUM: MAGNESIUM: 1.3 mg/dL — AB (ref 1.7–2.4)

## 2017-04-30 LAB — BASIC METABOLIC PANEL
Anion gap: 15 (ref 5–15)
BUN: 17 mg/dL (ref 6–20)
CHLORIDE: 99 mmol/L — AB (ref 101–111)
CO2: 23 mmol/L (ref 22–32)
Calcium: 9.4 mg/dL (ref 8.9–10.3)
Creatinine, Ser: 0.97 mg/dL (ref 0.61–1.24)
GFR calc non Af Amer: >60 mL/min (ref >60)
Glucose, Bld: 88 mg/dL (ref 65–99)
Potassium: 2.3 mmol/L — CL (ref 3.5–5.1)
Sodium: 137 mmol/L (ref 135–145)

## 2017-04-30 LAB — C DIFFICILE QUICK SCREEN W PCR REFLEX
C DIFFICILE (CDIFF) TOXIN: NEGATIVE
C Diff antigen: NEGATIVE
C Diff interpretation: NOT DETECTED

## 2017-04-30 LAB — D-DIMER, QUANTITATIVE: D-Dimer, Quant: 0.44 ug/mL-FEU (ref 0.00–0.50)

## 2017-04-30 MED ORDER — THIAMINE HCL 100 MG/ML IJ SOLN
Freq: Once | INTRAVENOUS | Status: DC
  Filled 2017-04-30: qty 1000

## 2017-04-30 MED ORDER — ADULT MULTIVITAMIN W/MINERALS CH
1.0000 | ORAL_TABLET | Freq: Once | ORAL | Status: AC
  Administered 2017-04-30: 1 via ORAL
  Filled 2017-04-30: qty 1

## 2017-04-30 MED ORDER — SODIUM CHLORIDE 0.9 % IV SOLN
INTRAVENOUS | Status: DC
  Administered 2017-04-30: 18:00:00 via INTRAVENOUS

## 2017-04-30 MED ORDER — POTASSIUM CHLORIDE ER 10 MEQ PO TBCR: 40.0000 meq | EXTENDED_RELEASE_TABLET | Freq: Three times a day (TID) | ORAL | 0 refills | Status: AC

## 2017-04-30 MED ORDER — MAGNESIUM OXIDE -MG SUPPLEMENT 500 MG PO TABS: 500.0000 mg | ORAL_TABLET | Freq: Three times a day (TID) | ORAL | 0 refills | Status: AC

## 2017-04-30 MED ORDER — POTASSIUM CHLORIDE 10 MEQ/100ML IV SOLN
10.0000 meq | INTRAVENOUS | Status: AC
  Administered 2017-04-30: 10 meq via INTRAVENOUS
  Filled 2017-04-30: qty 100

## 2017-04-30 MED ORDER — MAGNESIUM SULFATE 2 GM/50ML IV SOLN
2.0000 g | Freq: Once | INTRAVENOUS | Status: AC
  Administered 2017-04-30: 2 g via INTRAVENOUS
  Filled 2017-04-30: qty 50

## 2017-04-30 MED ORDER — VITAMIN B-1 100 MG PO TABS
100.0000 mg | ORAL_TABLET | Freq: Once | ORAL | Status: AC
  Administered 2017-04-30: 100 mg via ORAL
  Filled 2017-04-30: qty 1

## 2017-04-30 MED ORDER — POTASSIUM CHLORIDE CRYS ER 20 MEQ PO TBCR
40.0000 meq | EXTENDED_RELEASE_TABLET | Freq: Once | ORAL | Status: AC
  Administered 2017-04-30: 40 meq via ORAL
  Filled 2017-04-30: qty 2

## 2017-04-30 MED ORDER — FOLIC ACID 1 MG PO TABS
1.0000 mg | ORAL_TABLET | Freq: Once | ORAL | Status: AC
  Administered 2017-04-30: 1 mg via ORAL
  Filled 2017-04-30: qty 1

## 2017-04-30 NOTE — ED Notes (Signed)
Pt returned from x-ray,  Pt to bathroom via wheelchair

## 2017-04-30 NOTE — ED Provider Notes (Signed)
[facility] EMERGENCY DEPARTMENT Provider Note   CSN: [id number] Arrival date & time: 04/30/17  1329  History   Chief Complaint Chief Complaint  Patient presents with  . low potassium   HPI Derek Whitaker is a 71 y.o. male.  The patient is a 71 year old male with a medical history significant for alcohol abuse, nephrolithiasis, anxiety, tobacco use, and hypertension, who presents to the ED with hypokalemia.  He reports that he received a call at home today and was told to come to the ED immediately.  He was evaluated 3 days ago in this ED for weakness and diarrhea, at which time he was treated with IV fluids, magnesium, folic acid, and thiamine.  He was given a prescription for folic acid, however he has not this filled. Today, he reports shortness of breath is worse with exertion, as well as numbness in his bilateral feet.  His diarrhea has improved since his recent evaluation, with one episode this morning.  He drinks ~1 pint of liquor daily, and has had 3 glasses of liquor today.  He denies fevers, nausea, vomiting, bloody stools, and abdominal pain.  He states, "I am already hog-tied to this bed.  Do whatever you need to do to me."   The history is provided by the patient and medical records. No language interpreter was used.   Past Medical History:  Diagnosis Date  . Alcohol abuse   . Asthma    childhood   . Herpes    facial/lip  . Hypertension   . Kidney calculi    PT reports MD is watching it ,has not removed it  . Sciatica    Patient Active Problem List   Diagnosis Date Noted  . Alcohol abuse 04/30/2017   Past Surgical History:  Procedure Laterality Date  . ABDOMINAL SURGERY     biopsy  . PROSTATE SURGERY    . TONSILLECTOMY       Home Medications    Prior to Admission medications   Medication Sig Start Date End Date Taking? Authorizing Provider  B Complex-C-Folic Acid (B COMPLEX-VITAMIN C-FOLIC ACID) 1 MG tablet Take 1 tablet by mouth daily  with breakfast. 04/27/17   Gerhard Munch, MD  cholecalciferol (VITAMIN D) 1000 UNITS tablet Take 1,000 Units by mouth daily.    [provider]  fluticasone (VERAMYST) 27.5 MCG/SPRAY nasal spray Place 2 sprays into the nose daily as needed. allergies    [provider]  lisinopril-hydrochlorothiazide (PRINZIDE,ZESTORETIC) 20-25 MG per tablet Take 1 tablet by mouth daily.    [provider]  loperamide (IMODIUM) 2 MG capsule Take 2 mg by mouth 4 (four) times daily as needed. For constipation.    [provider]  Loperamide-Simethicone (IMODIUM MULTI-SYMPTOM RELIEF) 2-125 MG TABS Take 1 capsule by mouth every 6 (six) hours as needed. For constipation.    [provider]  Magnesium Oxide 500 MG TABS Take 1 tablet (500 mg total) by mouth 3 (three) times daily. 04/30/17 05/05/17  Levester Fresh, MD  metoprolol tartrate (LOPRESSOR) 25 MG tablet Take 12.5 mg by mouth 2 (two) times daily.    [provider]  naphazoline (CLEAR EYES) 0.012 % ophthalmic solution Place 1 drop into both eyes 4 (four) times daily as needed. For allergy eyes    [provider]  phenazopyridine (PYRIDIUM) 100 MG tablet Take 100 mg by mouth 3 (three) times daily as needed. For burning/pain.    [provider]  potassium chloride (K-DUR) 10 MEQ tablet Take  4 tablets (40 mEq total) by mouth 3 (three) times daily. 04/30/17 05/05/17  Levester Fresh, MD  sertraline (ZOLOFT) 50 MG tablet Take 50 mg by mouth daily.    [provider]  sildenafil (VIAGRA) 100 MG tablet Take 100 mg by mouth daily as needed. For E.D.    [provider]  Tamsulosin HCl (FLOMAX) 0.4 MG CAPS Take 0.4 mg by mouth daily after supper.    [provider]   Family History No family history on file.  Social History Social History  Substance Use Topics  . Smoking status: Current Some Day Smoker    Types: Cigars  . Smokeless tobacco: Never Used  . Alcohol use Yes       Comment: 1/5 whiskey on daily average   Allergies   Penicillins  Review of Systems Review of Systems  Constitutional: Negative for chills and fever.  HENT: Negative.   Eyes: Negative for visual disturbance.  Respiratory: Positive for shortness of breath. Negative for cough.   Cardiovascular: Negative for chest pain and palpitations.  Gastrointestinal: Positive for diarrhea. Negative for abdominal pain, blood in stool, constipation, nausea and vomiting.  Genitourinary: Negative for dysuria, hematuria and urgency.  Musculoskeletal: Negative for arthralgias and back pain.  Skin: Negative for color change and rash.  Allergic/Immunologic: Negative for immunocompromised state.  Neurological: Positive for weakness (lower extremities; improved) and numbness (bilateral feet tingling). Negative for seizures and syncope.  Hematological: Negative.   Psychiatric/Behavioral: Negative.   All other systems reviewed and are negative.  Physical Exam Updated Vital Signs BP 137/82   Pulse 80   Resp 15   SpO2 98%   Physical Exam  Constitutional: He is oriented to person, place, and time. He appears well-developed and well-nourished. No distress.  HENT:  Head: Normocephalic and atraumatic.  Dry mucous membranes  Eyes: Conjunctivae and EOM are normal.  Neck: Neck supple.  Cardiovascular: Normal rate, regular rhythm, normal heart sounds and intact distal pulses.   No murmur heard. Pulmonary/Chest: Effort normal and breath sounds normal. No respiratory distress.  Abdominal: Soft. He exhibits no mass. There is no tenderness. There is no guarding.  Musculoskeletal: He exhibits no edema or tenderness.  Neurological: He is alert and oriented to person, place, and time.  5/5 strength in bilateral lower extremities; subjective normal sensation to feet in all nerve distributions on my exam  Skin: Skin is warm and dry.  Psychiatric: His behavior is normal. Judgment and thought content normal.   agitated  Nursing note and vitals reviewed.  ED Treatments / Results  Labs (all labs ordered are listed, but only abnormal results are displayed) Labs Reviewed  BASIC METABOLIC PANEL - Abnormal; Notable for the following:       Result Value   Potassium 2.3 (*)    Chloride 99 (*)    All other components within normal limits  MAGNESIUM - Abnormal; Notable for the following:    Magnesium 1.3 (*)    All other components within normal limits  C DIFFICILE QUICK SCREEN W PCR REFLEX  GASTROINTESTINAL PANEL BY PCR, STOOL (REPLACES STOOL CULTURE)  D-DIMER, QUANTITATIVE (NOT AT [facility])   EKG  EKG Interpretation  Date/Time:  Tuesday April 30 2017 14:33:56 EDT Ventricular Rate:  114 PR Interval:    QRS Duration: 110 QT Interval:  348 QTC Calculation: 494 R Axis:   -14 Text Interpretation:  Sinus tachycardia Abnormal R-wave progression, late transition Inferior infarct, old Lateral leads are also involved When compared with ECG of 04/27/2017,  No significant change was found Confirmed by Dione Booze ([id number]) on 04/30/2017 3:29:25 PM      Radiology Dg Chest 2 View  Result Date: 04/30/2017 CLINICAL DATA:  Shortness of breath. EXAM: CHEST  2 VIEW COMPARISON:  04/27/2017 . FINDINGS: Mediastinum and hilar structures are normal. Lungs are clear. Heart size normal. No pleural effusion or pneumothorax. Degenerative changes thoracic spine . IMPRESSION: Low lung volumes with mild basilar atelectasis. Electronically Signed   By: Maisie Fus  Register   On: 04/30/2017 16:06    Procedures Procedures (including critical care time)  Medications Ordered in ED Medications  potassium chloride 10 mEq in 100 mL IVPB (0 mEq Intravenous Stopped 04/30/17 1928)  0.9 %  sodium chloride infusion ( Intravenous Stopped 04/30/17 2227)  potassium chloride SA (K-DUR,KLOR-CON) CR tablet 40 mEq (40 mEq Oral Given 04/30/17 1754)  multivitamin with minerals tablet 1 tablet (1 tablet Oral Given 04/30/17 1811)  folic  acid (FOLVITE) tablet 1 mg (1 mg Oral Given 04/30/17 1810)  thiamine (VITAMIN B-1) tablet 100 mg (100 mg Oral Given 04/30/17 1756)  magnesium sulfate IVPB 2 g 50 mL (0 g Intravenous Stopped 04/30/17 2138)   Initial Impression / Assessment and Plan / ED Course  I have reviewed the triage vital signs and the nursing notes.  Pertinent labs & imaging results that were available during my care of the patient were reviewed by me and considered in my medical decision making (see chart for details).   Initial differential diagnosis included C.difficile, infectious diarrhea, electrolyte abnormality, enteritis, pericarditis, pleural effusion, and PE.  Pertinent labs included a BMP with hypokalemia and hypochloridemia; no AKI noted.  Hypomagnesemia (1.3).  D-dimer normal.  EKG with sinus tachycardia; no axis deviation.  Normal PR, narrow QRS, and prolonged QTc.  Flattened T waves diffusely; no U waves noted or T wave inversions to suggest ischemia.  ST depressions in V5 and V6, similar to prior EKG from 04/27/2017.  Imaging studies a CXR with no acute cardiopulmonary abnormalities.  The patient was given oral and IV potassium and magnesium, as well as oral MVI and folic acid in the setting of alcohol abuse.    The patient had multiple episodes of diarrhea while in the ED.  A stool sample was collected.  GI pathogen panel is pending, however the patient is negative for Clostridium difficile at this time.  Upon reassessment, reported he was feeling well. He was able to ambulate without assistance.  Based on the above findings, I suspect the patient's persistent diarrhea is the cause of his electrolyte abnormalities. His diarrhea may be related to his chronic alcohol use, or it may be infectious in nature although no sources have been identified.  Outpatient substance abuse resources were provided for the patient.  I discussed the above results with the patient who verbalized understanding.  The patient still has his  prescription for folic acid from his visit on 10/13, and I instructed him to fill this and take this medication daily.  I also provided prescriptions for magnesium and potassium, and strongly encouraged close follow-up with his PCP this week. Return precautions provided.  The patient was discharged in stable condition.   Final Clinical Impressions(s) / ED Diagnoses   Final diagnoses:  Hypokalemia  Hypomagnesemia  Diarrhea, unspecified type   New Prescriptions Discharge Medication List as of 04/30/2017 10:02 PM    START taking these medications   Details  Magnesium Oxide 500 MG TABS Take 1 tablet (500 mg total) by mouth 3 (three) times  daily., Starting Tue 04/30/2017, Until Sun 05/05/2017, Print    potassium chloride (K-DUR) 10 MEQ tablet Take 4 tablets (40 mEq total) by mouth 3 (three) times daily., Starting Tue 04/30/2017, Until Sun 05/05/2017, Print         Levester Fresh, MD 05/01/17 [id number]    Dione Booze, MD 05/02/17 [phone]

## 2017-04-30 NOTE — ED Notes (Signed)
Pt returned from bathroom via wheel chair

## 2017-04-30 NOTE — ED Notes (Signed)
Potassium 2.3 called by lab and reported to Dr. Corlis Leak

## 2017-04-30 NOTE — ED Triage Notes (Signed)
Patient reports that he was called today by unknown medical person and told to come to ED because potassium drawn on Saturday was 2.9. No complaints, alert and oriented, NAD

## 2017-04-30 NOTE — ED Notes (Signed)
Pt remains in bathroom at this time.  Pt st's he is fine.

## 2017-04-30 NOTE — Discharge Instructions (Addendum)
Please fill your prescription for folic acid and take this medication daily.  Take the magnesium and potassium as prescribed.  You will need close follow-up due to your electrolyte abnormalities.  Please see your primary care doctor within the next 2 days for re-evaluation.  Return to the ED for worsening symptoms.

## 2017-04-30 NOTE — ED Notes (Signed)
Pt to bathroom via wheelchair.

## 2017-05-01 LAB — GASTROINTESTINAL PANEL BY PCR, STOOL (REPLACES STOOL CULTURE)

## 2017-10-16 ENCOUNTER — Encounter: Payer: Self-pay | Admitting: Surgical

## 2018-10-16 IMAGING — CR DG CHEST 2V
2 series · 2 of 2 positions shown · non-contrast
Comparison: 12/04/2011

CLINICAL DATA: Leg weakness with onset 1 week ago.

EXAM:
CHEST  2 VIEW

[chest lat]
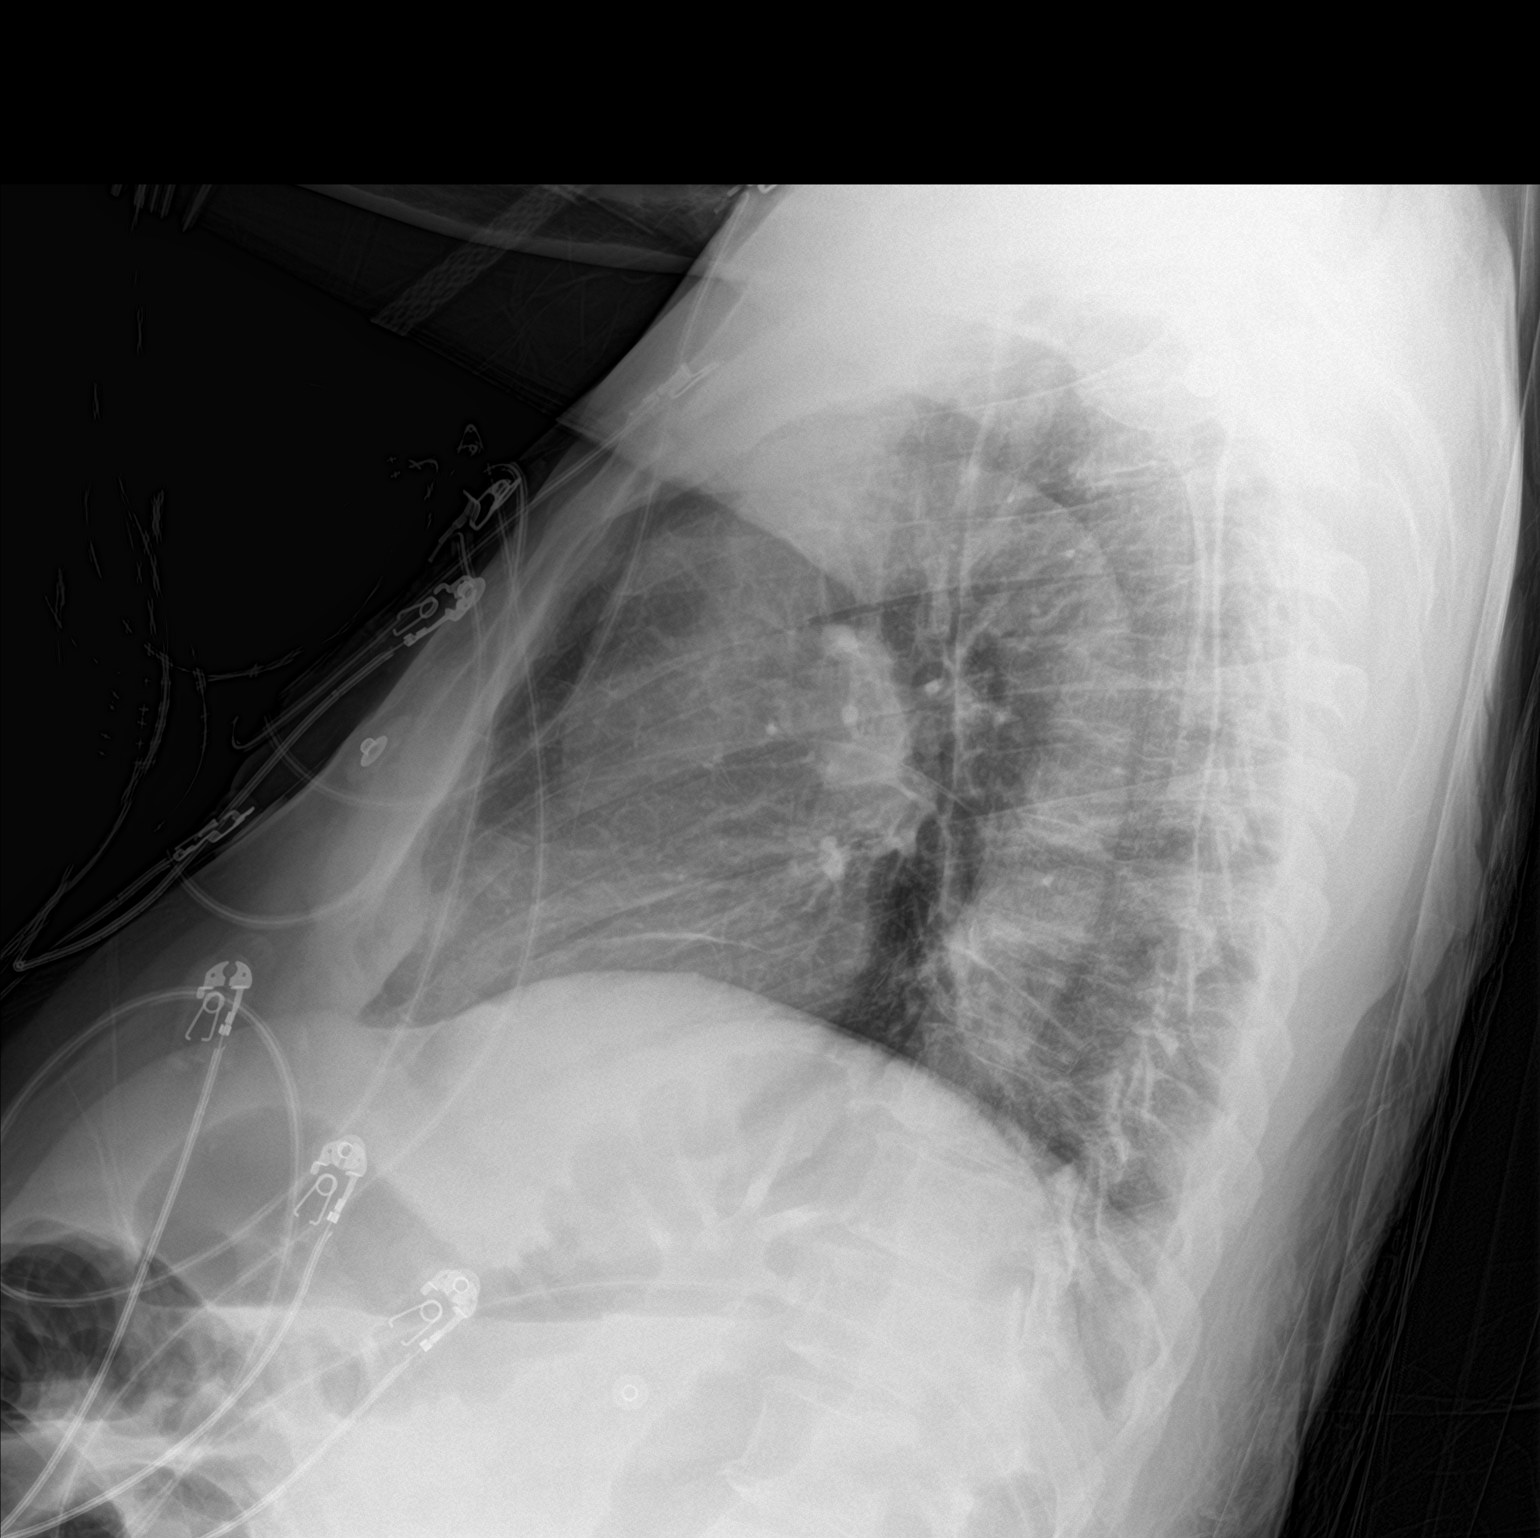

[chest ap]
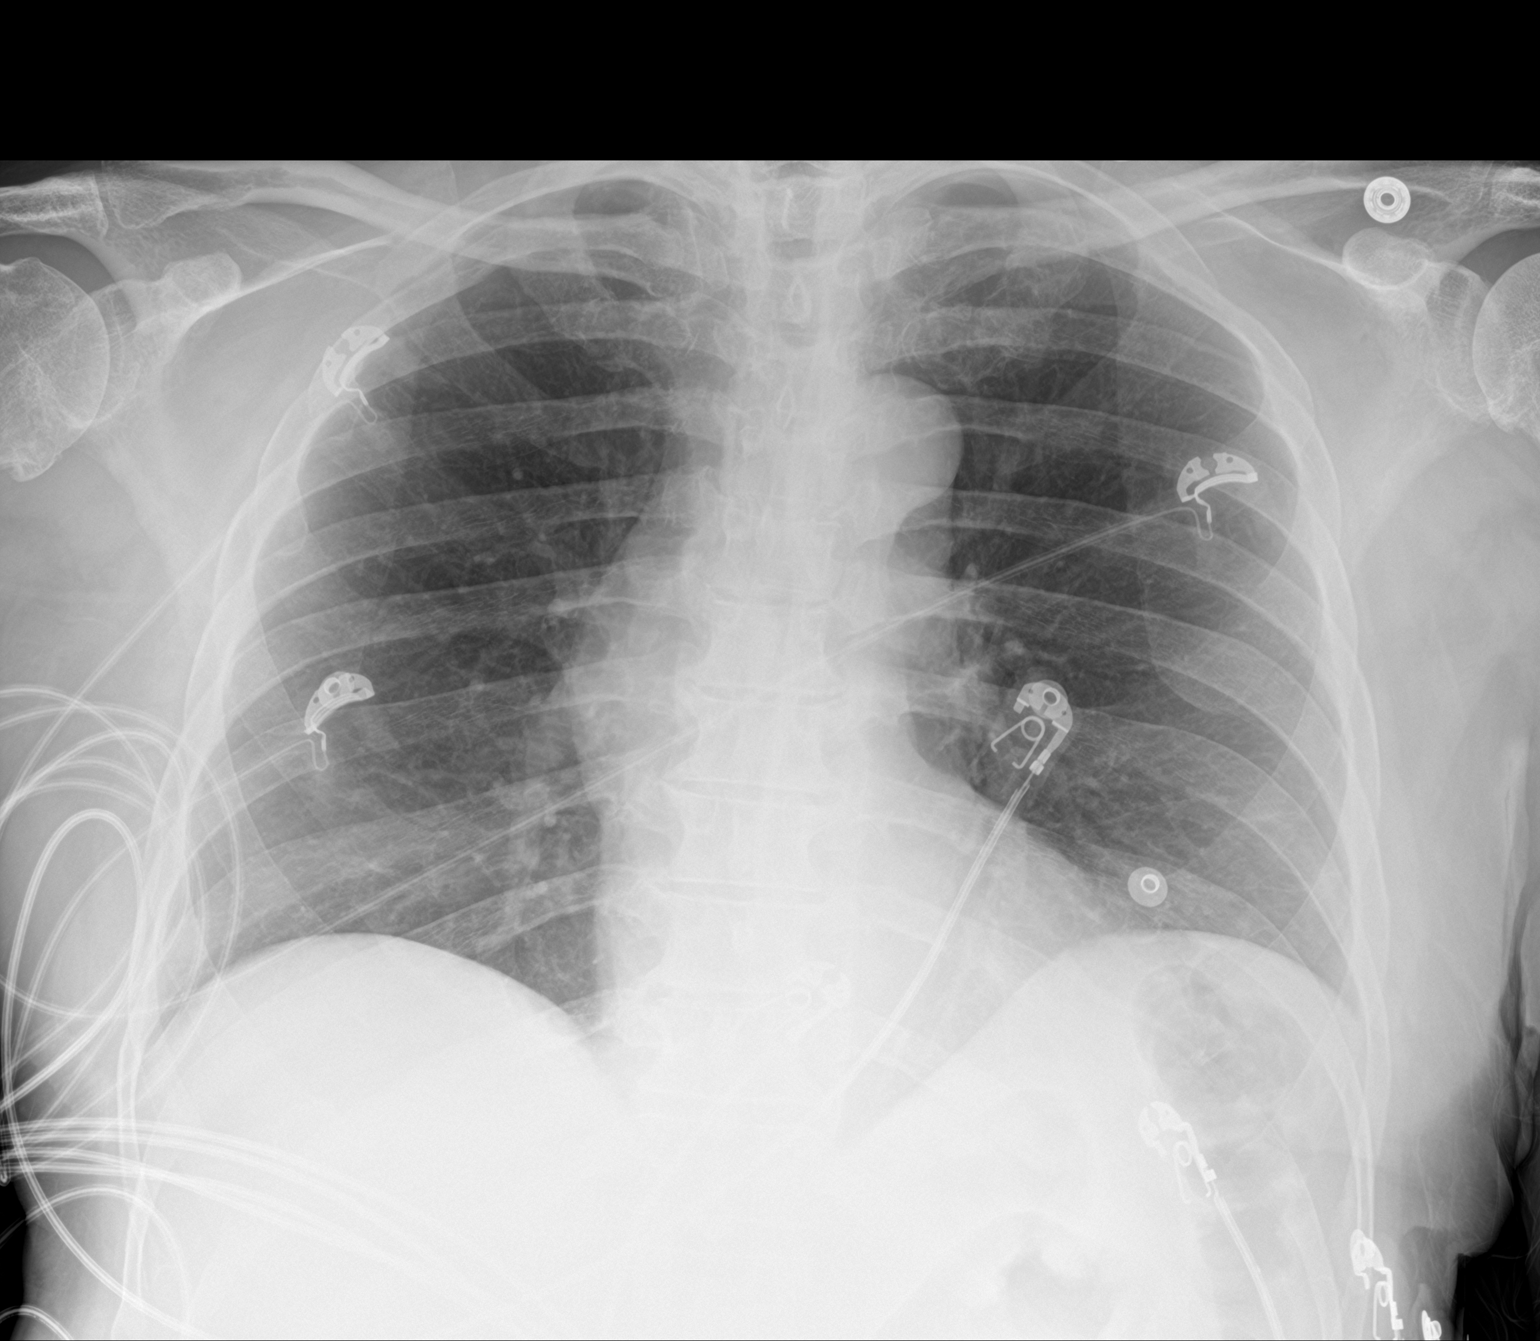

[2 of 2 positions shown; findings below may reference images not displayed]

FINDINGS: Cardiomediastinal silhouette is normal. Mediastinal contours appear
intact. Tortuosity and calcific atherosclerotic disease of the
aorta.

There is no evidence of focal airspace consolidation, pleural
effusion or pneumothorax.

Osseous structures are without acute abnormality. Soft tissues are
grossly normal.
IMPRESSION: No active cardiopulmonary disease.

Tortuosity and calcific atherosclerotic disease of the aorta.

## 2018-11-14 DEATH — deceased
